# Patient Record
Sex: Female | Born: 1985 | Race: Black or African American | Hispanic: No | Marital: Married | Smoking: Never smoker
Health system: Southern US, Community
[De-identification: ages and names within clinical notes are randomized; demographics above are authoritative.]

## PROBLEM LIST (undated history)

## (undated) ENCOUNTER — Inpatient Hospital Stay (HOSPITAL_COMMUNITY): Payer: Self-pay

## (undated) DIAGNOSIS — R51 Headache: Secondary | ICD-10-CM

## (undated) DIAGNOSIS — R519 Headache, unspecified: Secondary | ICD-10-CM

## (undated) HISTORY — PX: NO PAST SURGERIES: SHX2092

---

## 2015-05-24 ENCOUNTER — Inpatient Hospital Stay (HOSPITAL_COMMUNITY)
Admission: AD | Admit: 2015-05-24 | Discharge: 2015-05-24 | Disposition: A | Discharge status: Home / Self Care | Payer: Self-pay | Source: Ambulatory Visit | Attending: Family Medicine | Admitting: Family Medicine

## 2015-05-24 ENCOUNTER — Encounter (HOSPITAL_COMMUNITY): Payer: Self-pay | Admitting: *Deleted

## 2015-05-24 ENCOUNTER — Inpatient Hospital Stay (HOSPITAL_COMMUNITY): Payer: Self-pay

## 2015-05-24 DIAGNOSIS — O3680X0 Pregnancy with inconclusive fetal viability, not applicable or unspecified: Secondary | ICD-10-CM

## 2015-05-24 DIAGNOSIS — O4691 Antepartum hemorrhage, unspecified, first trimester: Secondary | ICD-10-CM

## 2015-05-24 DIAGNOSIS — O209 Hemorrhage in early pregnancy, unspecified: Secondary | ICD-10-CM | POA: Insufficient documentation

## 2015-05-24 HISTORY — DX: Headache, unspecified: R51.9

## 2015-05-24 HISTORY — DX: Headache: R51

## 2015-05-24 LAB — WET PREP, GENITAL
Sperm: NONE SEEN
TRICH WET PREP: NONE SEEN
Yeast Wet Prep HPF POC: NONE SEEN

## 2015-05-24 LAB — CBC WITH DIFFERENTIAL/PLATELET
BASOS PCT: 0 %
Basophils Absolute: 0 10*3/uL (ref 0.0–0.1)
EOS PCT: 2 %
Eosinophils Absolute: 0.2 10*3/uL (ref 0.0–0.7)
HCT: 35.1 % — ABNORMAL LOW (ref 36.0–46.0)
Hemoglobin: 12.1 g/dL (ref 12.0–15.0)
Lymphocytes Relative: 49 %
Lymphs Abs: 4.6 10*3/uL — ABNORMAL HIGH (ref 0.7–4.0)
MCH: 28.7 pg (ref 26.0–34.0)
MCHC: 34.5 g/dL (ref 30.0–36.0)
MCV: 83.2 fL (ref 78.0–100.0)
MONO ABS: 0.6 10*3/uL (ref 0.1–1.0)
Monocytes Relative: 7 %
Neutro Abs: 3.8 10*3/uL (ref 1.7–7.7)
Neutrophils Relative %: 42 %
PLATELETS: 329 10*3/uL (ref 150–400)
RBC: 4.22 MIL/uL (ref 3.87–5.11)
RDW: 13.2 % (ref 11.5–15.5)
WBC: 9.2 10*3/uL (ref 4.0–10.5)

## 2015-05-24 LAB — URINALYSIS, ROUTINE W REFLEX MICROSCOPIC
Bilirubin Urine: NEGATIVE
Glucose, UA: NEGATIVE mg/dL
Ketones, ur: NEGATIVE mg/dL
LEUKOCYTES UA: NEGATIVE
NITRITE: NEGATIVE
Protein, ur: NEGATIVE mg/dL
SPECIFIC GRAVITY, URINE: 1.015 (ref 1.005–1.030)
pH: 7 (ref 5.0–8.0)

## 2015-05-24 LAB — URINE MICROSCOPIC-ADD ON: WBC, UA: NONE SEEN WBC/hpf (ref 0–5)

## 2015-05-24 LAB — HCG, QUANTITATIVE, PREGNANCY: hCG, Beta Chain, Quant, S: 70 m[IU]/mL — ABNORMAL HIGH (ref <5)

## 2015-05-24 LAB — ABO/RH: ABO/RH(D): A POS

## 2015-05-24 LAB — POCT PREGNANCY, URINE: PREG TEST UR: POSITIVE — AB

## 2015-05-24 MED ORDER — CYCLOBENZAPRINE HCL 10 MG PO TABS
10.0000 mg | ORAL_TABLET | Freq: Once | ORAL | Status: AC
  Administered 2015-05-24: 10 mg via ORAL
  Filled 2015-05-24: qty 1

## 2015-05-24 NOTE — MAU Provider Note (Signed)
History     CSN: [id number]  Arrival date and time: 05/24/15 1258   First Provider Initiated Contact with Patient 05/24/15 1410      Chief Complaint  Patient presents with  . Headache  . Possible Pregnancy   HPI Breanna Ochoa 29 y.o. G1P0 newly pregnant female presents to MAU complaining of abdominal pain and headache.  She has a h/o headaches but they have been worse over the last few days.  She woke up with one at 4am today.  Tylenol was not helpful and HA continues to now.   She has had irregular bleeding on and off over the last two weeks.  She also has abdominal pain that started 2 days ago.  She went to Urgent Care 2 days ago where she had a positive pregnancy test and was told to use Tylenol.  She denies fever, dysuria, weakness, nausea, vomiting OB History    Gravida Para Term Preterm AB TAB SAB Ectopic Multiple Living   1         0      Past Medical History  Diagnosis Date  . Headache     Past Surgical History  Procedure Laterality Date  . No past surgeries      History reviewed. No pertinent family history.  Social History  Substance Use Topics  . Smoking status: Never Smoker   . Smokeless tobacco: None  . Alcohol Use: No    Allergies: No Known Allergies  No prescriptions prior to admission    ROS Pertinent ROS in HPI.  All other systems are negative.   Physical Exam   Blood pressure 115/70, pulse 75, temperature 98.3 F (36.8 C), resp. rate 18, height  (1.626 m), weight 130 lb (58.968 kg), last menstrual period 05/24/2015.  Physical Exam  Constitutional: She is oriented to person, place, and time. She appears well-developed and well-nourished. No distress.  HENT:  Head: Normocephalic and atraumatic.  Eyes: Conjunctivae and EOM are normal.  Neck: Normal range of motion. Neck supple.  Cardiovascular: Normal rate and normal heart sounds.   Respiratory: Effort normal. No respiratory distress.  GI: Soft. Bowel sounds are normal. She exhibits  no distension. There is tenderness. There is no rebound and no guarding.  LLQ tender to palpation  Musculoskeletal: Normal range of motion. She exhibits no edema.  Neurological: She is alert and oriented to person, place, and time.  Skin: Skin is warm and dry.  Psychiatric: She has a normal mood and affect. Her behavior is normal.  Gyn: mod amt of dark red blood present in vault.  No active bleeding.  No tenderness of adnexa.  No CMT.   Results for orders placed or performed during the hospital encounter of 05/24/15 (from the past 24 hour(s))  Urinalysis, Routine w reflex microscopic (not at [facility])     Status: Abnormal   Collection Time: 05/24/15  1:30 PM  Result Value Ref Range   Color, Urine YELLOW YELLOW   APPearance CLEAR CLEAR   Specific Gravity, Urine 1.015 1.005 - 1.030   pH 7.0 5.0 - 8.0   Glucose, UA NEGATIVE NEGATIVE mg/dL   Hgb urine dipstick LARGE (A) NEGATIVE   Bilirubin Urine NEGATIVE NEGATIVE   Ketones, ur NEGATIVE NEGATIVE mg/dL   Protein, ur NEGATIVE NEGATIVE mg/dL   Nitrite NEGATIVE NEGATIVE   Leukocytes, UA NEGATIVE NEGATIVE  Urine microscopic-add on     Status: Abnormal   Collection Time: 05/24/15  1:30 PM  Result Value Ref Range  Squamous Epithelial / LPF 0-5 (A) NONE SEEN   WBC, UA NONE SEEN 0 - 5 WBC/hpf   RBC / HPF 0-5 0 - 5 RBC/hpf   Bacteria, UA RARE (A) NONE SEEN  Pregnancy, urine POC     Status: Abnormal   Collection Time: 05/24/15  1:33 PM  Result Value Ref Range   Preg Test, Ur POSITIVE (A) NEGATIVE  Wet prep, genital     Status: Abnormal   Collection Time: 05/24/15  2:23 PM  Result Value Ref Range   Yeast Wet Prep HPF POC NONE SEEN NONE SEEN   Trich, Wet Prep NONE SEEN NONE SEEN   Clue Cells Wet Prep HPF POC PRESENT (A) NONE SEEN   WBC, Wet Prep HPF POC MANY (A) NONE SEEN   Sperm NONE SEEN   CBC with Differential/Platelet     Status: Abnormal   Collection Time: 05/24/15  2:33 PM  Result Value Ref Range   WBC 9.2 4.0 - 10.5 K/uL   RBC 4.22  3.87 - 5.11 MIL/uL   Hemoglobin 12.1 12.0 - 15.0 g/dL   HCT 16.135.1 (L) 09.636.0 - 04.546.0 %   MCV 83.2 78.0 - 100.0 fL   MCH 28.7 26.0 - 34.0 pg   MCHC 34.5 30.0 - 36.0 g/dL   RDW 40.913.2 81.111.5 - 91.415.5 %   Platelets 329 150 - 400 K/uL   Neutrophils Relative % 42 %   Neutro Abs 3.8 1.7 - 7.7 K/uL   Lymphocytes Relative 49 %   Lymphs Abs 4.6 (H) 0.7 - 4.0 K/uL   Monocytes Relative 7 %   Monocytes Absolute 0.6 0.1 - 1.0 K/uL   Eosinophils Relative 2 %   Eosinophils Absolute 0.2 0.0 - 0.7 K/uL   Basophils Relative 0 %   Basophils Absolute 0.0 0.0 - 0.1 K/uL  ABO/Rh     Status: None (Preliminary result)   Collection Time: 05/24/15  2:33 PM  Result Value Ref Range   ABO/RH(D) A POS   hCG, quantitative, pregnancy     Status: Abnormal   Collection Time: 05/24/15  2:33 PM  Result Value Ref Range   hCG, Beta Chain, Quant, S 70 (H) <5 mIU/mL   [place] Ob Comp Less 14 Wks  05/24/2015  CLINICAL DATA:  First trimester pregnancy, vaginal bleeding EXAM: OBSTETRIC <14 WK US AND TRANSVAGINAL OB US TECHNIQUE: Both transabdominal and transvaginal ultrasound examinations were performed for complete evaluation of the gestation as well as the maternal uterus, adnexal regions, and pelvic cul-de-sac. Transvaginal technique was performed to assess early pregnancy. COMPARISON:  None. FINDINGS: Intrauterine gestational sac: Not visualized Yolk sac:  Not visualized Embryo:  Not visualized Cardiac Activity: Not visualized Maternal uterus/adnexae: Endometrium is thickened to 11 mm. Ovaries are normal. Small simple cyst possibly a corpus luteum left ovary. No free fluid. IMPRESSION: Endometrial thickening. Small simple cyst left ovary possibly a corpus luteum. Recommend follow-up quantitative B-HCG levels and follow-up US in 14 days to confirm and assess viability. This recommendation follows SRU consensus guidelines: Diagnostic Criteria for Nonviable Pregnancy Early in the First Trimester. Malva Limes Engl J Med 2013; 782:9562-13; 369:1443-51. Electronically  Signed   By: Esperanza Heiraymond  Rubner M.D.   On: 05/24/2015 15:30   [place] Ob Transvaginal  05/24/2015  CLINICAL DATA:  First trimester pregnancy, vaginal bleeding EXAM: OBSTETRIC <14 WK US AND TRANSVAGINAL OB US TECHNIQUE: Both transabdominal and transvaginal ultrasound examinations were performed for complete evaluation of the gestation as well as the maternal uterus, adnexal regions, and pelvic cul-de-sac. Transvaginal  technique was performed to assess early pregnancy. COMPARISON:  None. FINDINGS: Intrauterine gestational sac: Not visualized Yolk sac:  Not visualized Embryo:  Not visualized Cardiac Activity: Not visualized Maternal uterus/adnexae: Endometrium is thickened to 11 mm. Ovaries are normal. Small simple cyst possibly a corpus luteum left ovary. No free fluid. IMPRESSION: Endometrial thickening. Small simple cyst left ovary possibly a corpus luteum. Recommend follow-up quantitative B-HCG levels and follow-up US in 14 days to confirm and assess viability. This recommendation follows SRU consensus guidelines: Diagnostic Criteria for Nonviable Pregnancy Early in the First Trimester. Malva Limes Med 2013; 161:0960-45. Electronically Signed   By: Esperanza Heir M.D.   On: 05/24/2015 15:30    MAU Course  Procedures  MDM Ectopic workup ordered to eval vaginal bleeding in early pregnancy Flexeril ordered for discomfort Rh positive blood type No evidence for ectopic on u/s but cannot confirm location Hemodynamically stable  Assessment and Plan  A:  1. Pregnancy of unknown anatomic location   2. Vaginal bleeding in pregnancy, first trimester    P: Discharge to home OTC Tylenol for discomfort Ectopic precautions discussed.  Return for heavy bleeding or severe pain.  Return to MAU in 48 hours for repeat quant HCG Patient may return to MAU as needed or if her condition were to change or worsen   Bertram Denver 05/24/2015, 2:10 PM

## 2015-05-24 NOTE — MAU Note (Signed)
Pt presents to MAU with complaints of a headache and lower back pain. Pt was evaluated at urgent care on Wednesday for the same symptoms and then told she was pregnant. Significant other states he brought her here due to the headache and back pain

## 2015-05-24 NOTE — Discharge Instructions (Signed)
Eating Plan for Pregnant Women °While you are pregnant, your body will require additional nutrition to help support your growing baby. It is recommended that you consume: °· 150 additional calories each day during your first trimester. °· 300 additional calories each day during your second trimester. °· 300 additional calories each day during your third trimester. °Eating a healthy, well-balanced diet is very important for your health and for your baby's health. You also have a higher need for some vitamins and minerals, such as folic acid, calcium, iron, and vitamin D. °WHAT DO I NEED TO KNOW ABOUT EATING DURING PREGNANCY? °· Do not try to lose weight or go on a diet during pregnancy. °· Choose healthy, nutritious foods. Choose ½ of a sandwich with a glass of milk instead of a candy bar or a high-calorie sugar-sweetened beverage. °· Limit your overall intake of foods that have "empty calories." These are foods that have little nutritional value, such as sweets, desserts, candies, sugar-sweetened beverages, and fried foods. °· Eat a variety of foods, especially fruits and vegetables. °· Take a prenatal vitamin to help meet the additional needs during pregnancy, specifically for folic acid, iron, calcium, and vitamin D. °· Remember to stay active. Ask your health care provider for exercise recommendations that are specific to you. °· Practice good food safety and cleanliness, such as washing your hands before you eat and after you prepare raw meat. This helps to prevent foodborne illnesses, such as listeriosis, that can be very dangerous for your baby. Ask your health care provider for more information about listeriosis. °WHAT DOES 150 EXTRA CALORIES LOOK LIKE? °Healthy options for an additional 150 calories each day could be any of the following: °· Plain low-fat yogurt (6-8 oz) with ½ cup of berries. °· 1 apple with 2 teaspoons of peanut butter. °· Cut-up vegetables with ¼ cup of hummus. °· Low-fat chocolate milk  (8 oz or 1 cup). °· 1 string cheese with 1 medium orange. °· ½ of a peanut butter and jelly sandwich on whole-wheat bread (1 tsp of peanut butter). °For 300 calories, you could eat two of those healthy options each day.  °WHAT IS A HEALTHY AMOUNT OF WEIGHT TO GAIN? °The recommended amount of weight for you to gain is based on your pre-pregnancy BMI. If your pre-pregnancy BMI was: °· Less than 18 (underweight), you should gain 28-40 lb. °· 18-24.9 (normal), you should gain 25-35 lb. °· 25-29.9 (overweight), you should gain 15-25 lb. °· Greater than 30 (obese), you should gain 11-20 lb. °WHAT IF I AM HAVING TWINS OR MULTIPLES? °Generally, pregnant women who will be having twins or multiples may need to increase their daily calories by 300-600 calories each day. The recommended range for total weight gain is 25-54 lb, depending on your pre-pregnancy BMI. Talk with your health care provider for specific guidance about additional nutritional needs, weight gain, and exercise during your pregnancy. °WHAT FOODS CAN I EAT? °Grains °Any grains. Try to choose whole grains, such as whole-wheat bread, oatmeal, or brown rice. °Vegetables °Any vegetables. Try to eat a variety of colors and types of vegetables to get a full range of vitamins and minerals. Remember to wash your vegetables well before eating. °Fruits °Any fruits. Try to eat a variety of colors and types of fruit to get a full range of vitamins and minerals. Remember to wash your fruits well before eating. °Meats and Other Protein Sources °Lean meats, including chicken, turkey, fish, and lean cuts of beef, veal, or pork.   Make sure that all meats are cooked to "well done." Tofu. Tempeh. Beans. Eggs. Peanut butter and other nut butters. Seafood, such as shrimp, crab, and lobster. If you choose fish, select types that are higher in omega-3 fatty acids, including salmon, herring, mussels, trout, sardines, and pollock. Make sure that all meats are cooked to food-safe  temperatures. Dairy Pasteurized milk and milk alternatives. Pasteurized yogurt and pasteurized cheese. Cottage cheese. Sour cream. Beverages Water. Juices that contain 100% fruit juice or vegetable juice. Caffeine-free teas and decaffeinated coffee. Drinks that contain caffeine are okay to drink, but it is better to avoid caffeine. Keep your total caffeine intake to less than 200 mg each day (12 oz of coffee, tea, or soda) or as directed by your health care provider. Condiments Any pasteurized condiments. Sweets and Desserts Any sweets and desserts. Fats and Oils Any fats and oils. The items listed above may not be a complete list of recommended foods or beverages. Contact your dietitian for more options. WHAT FOODS ARE NOT RECOMMENDED? Vegetables Unpasteurized (raw) vegetable juices. Fruits Unpasteurized (raw) fruit juices. Meats and Other Protein Sources Cured meats that have nitrates, such as bacon, salami, and hotdogs. Luncheon meats, bologna, or other deli meats (unless they are reheated until they are steaming hot). Refrigerated pate, meat spreads from a meat counter, smoked seafood that is found in the refrigerated section of a store. Raw fish, such as sushi or sashimi. High mercury content fish, such as tilefish, shark, swordfish, and king mackerel. Raw meats, such as tuna or beef tartare. Undercooked meats and poultry. Make sure that all meats are cooked to food-safe temperatures. Dairy Unpasteurized (raw) milk and any foods that have raw milk in them. Soft cheeses, such as feta, queso blanco, queso fresco, Brie, Camembert cheeses, blue-veined cheeses, and Panela cheese (unless it is made with pasteurized milk, which must be stated on the label). Beverages Alcohol. Sugar-sweetened beverages, such as sodas, teas, or energy drinks. Condiments Homemade fermented foods and drinks, such as pickles, sauerkraut, or kombucha drinks. (Store-bought pasteurized versions of these are  okay.) Other Salads that are made in the store, such as ham salad, chicken salad, egg salad, tuna salad, and seafood salad. The items listed above may not be a complete list of foods and beverages to avoid. Contact your dietitian for more information.   This information is not intended to replace advice given to you by your health care provider. Make sure you discuss any questions you have with your health care provider.   Document Released: 03/30/2014 Document Reviewed: 03/30/2014 Elsevier Interactive Patient Education [employer] Ectopic Pregnancy An ectopic pregnancy is when the fertilized egg attaches (implants) outside the uterus. Most ectopic pregnancies occur in the fallopian tube. Rarely do ectopic pregnancies occur on the ovary, intestine, pelvis, or cervix. In an ectopic pregnancy, the fertilized egg does not have the ability to develop into a normal, healthy baby.  A ruptured ectopic pregnancy is one in which the fallopian tube gets torn or bursts and results in internal bleeding. Often there is intense abdominal pain, and sometimes, vaginal bleeding. Having an ectopic pregnancy can be life threatening. If left untreated, this dangerous condition can lead to a blood transfusion, abdominal surgery, or even death. CAUSES  Damage to the fallopian tubes is the suspected cause in most ectopic pregnancies.  RISK FACTORS Depending on your circumstances, the risk of having an ectopic pregnancy will vary. The level of risk can be divided into three categories. High Risk  You have  gone through infertility treatment.  You have had a previous ectopic pregnancy.  You have had previous tubal surgery.  You have had previous surgery to have the fallopian tubes tied (tubal ligation).  You have tubal problems or diseases.  You have been exposed to DES. DES is a medicine that was used until 1971 and had effects on babies whose mothers took the medicine.  You become pregnant while using  an intrauterine device (IUD) for birth control. Moderate Risk  You have a history of infertility.  You have a history of a sexually transmitted infection (STI).  You have a history of pelvic inflammatory disease (PID).  You have scarring from endometriosis.  You have multiple sexual partners.  You smoke. Low Risk  You have had previous pelvic surgery.  You use vaginal douching.  You became sexually active before 27 years of age. SIGNS AND SYMPTOMS  An ectopic pregnancy should be suspected in anyone who has missed a period and has abdominal pain or bleeding.  You may experience normal pregnancy symptoms, such as:  Nausea.  Tiredness.  Breast tenderness.  Other symptoms may include:  Pain with intercourse.  Irregular vaginal bleeding or spotting.  Cramping or pain on one side or in the lower abdomen.  Fast heartbeat.  Passing out while having a bowel movement.  Symptoms of a ruptured ectopic pregnancy and internal bleeding may include:  Sudden, severe pain in the abdomen and pelvis.  Dizziness or fainting.  Pain in the shoulder area. DIAGNOSIS  Tests that may be performed include:  A pregnancy test.  An ultrasound test.  Testing the specific level of pregnancy hormone in the bloodstream.  Taking a sample of uterus tissue (dilation and curettage, D&C).  Surgery to perform a visual exam of the inside of the abdomen using a thin, lighted tube with a tiny camera on the end (laparoscope). TREATMENT  An injection of a medicine called methotrexate may be given. This medicine causes the pregnancy tissue to be absorbed. It is given if:  The diagnosis is made early.  The fallopian tube has not ruptured.  You are considered to be a good candidate for the medicine. Usually, pregnancy hormone blood levels are checked after methotrexate treatment. This is to be sure the medicine is effective. It may take 4-6 weeks for the pregnancy to be absorbed (though most  pregnancies will be absorbed by 3 weeks). Surgical treatment may be needed. A laparoscope may be used to remove the pregnancy tissue. If severe internal bleeding occurs, a cut (incision) may be made in the lower abdomen (laparotomy), and the ectopic pregnancy is removed. This stops the bleeding. Part of the fallopian tube, or the whole tube, may be removed as well (salpingectomy). After surgery, pregnancy hormone tests may be done to be sure there is no pregnancy tissue left. You may receive a Rho (D) immune globulin shot if you are Rh negative and the father is Rh positive, or if you do not know the Rh type of the father. This is to prevent problems with any future pregnancy. SEEK IMMEDIATE MEDICAL CARE IF:  You have any symptoms of an ectopic pregnancy. This is a medical emergency. MAKE SURE YOU:  Understand these instructions.  Will watch your condition.  Will get help right away if you are not doing well or get worse.   This information is not intended to replace advice given to you by your health care provider. Make sure you discuss any questions you have with your health  care provider.   Document Released: 07/23/2004 Document Revised: 07/06/2014 Document Reviewed: 01/12/2013 Elsevier Interactive Patient Education [employer]

## 2015-05-25 LAB — HIV ANTIBODY (ROUTINE TESTING W REFLEX): HIV Screen 4th Generation wRfx: NONREACTIVE

## 2015-05-25 LAB — RPR: RPR: NONREACTIVE

## 2015-05-26 ENCOUNTER — Encounter (HOSPITAL_COMMUNITY): Payer: Self-pay | Admitting: Advanced Practice Midwife

## 2015-05-26 ENCOUNTER — Inpatient Hospital Stay (HOSPITAL_COMMUNITY)
Admission: AD | Admit: 2015-05-26 | Discharge: 2015-05-26 | Disposition: A | Discharge status: Home / Self Care | Payer: Self-pay | Source: Ambulatory Visit | Attending: Family Medicine | Admitting: Family Medicine

## 2015-05-26 DIAGNOSIS — O034 Incomplete spontaneous abortion without complication: Secondary | ICD-10-CM

## 2015-05-26 DIAGNOSIS — O039 Complete or unspecified spontaneous abortion without complication: Secondary | ICD-10-CM

## 2015-05-26 DIAGNOSIS — Z3A01 Less than 8 weeks gestation of pregnancy: Secondary | ICD-10-CM | POA: Insufficient documentation

## 2015-05-26 DIAGNOSIS — B9689 Other specified bacterial agents as the cause of diseases classified elsewhere: Secondary | ICD-10-CM

## 2015-05-26 DIAGNOSIS — O209 Hemorrhage in early pregnancy, unspecified: Secondary | ICD-10-CM | POA: Insufficient documentation

## 2015-05-26 DIAGNOSIS — N760 Acute vaginitis: Secondary | ICD-10-CM

## 2015-05-26 LAB — HCG, QUANTITATIVE, PREGNANCY: HCG, BETA CHAIN, QUANT, S: 27 m[IU]/mL — AB (ref <5)

## 2015-05-26 MED ORDER — METRONIDAZOLE 500 MG PO TABS: 500.0000 mg | ORAL_TABLET | Freq: Two times a day (BID) | ORAL | Status: DC

## 2015-05-26 NOTE — MAU Provider Note (Signed)
Chief Complaint  Patient presents with  . Labs Only    Subjective:   Pt is a 29 y.o. G1P0 here for follow-up BHCG.  Upon review of the records patient was first seen on 05/24/15 for vaginal bleeding in early pregnancy.   BHCG on that day was 70.  Ultrasound showed Endometrial thickening and CLCyst.  GC/CT and wet prep were collected.    Pt here today with no report of abdominal pain or vaginal bleeding.   All other systems negative.      RN Note: Pt presents to MAU for repeat labs, BHCG. Denies any pain, reports vaginal bleeding.           Past Medical History  Diagnosis Date  . Headache     OB History  Gravida Para Term Preterm AB SAB TAB Ectopic Multiple Living  1         0    # Outcome Date GA Lbr Len/2nd Weight Sex Delivery Anes PTL Lv  1 Current               No family history on file. Medical, Surgical, Family and Social histories reviewed and are listed above.  Medications and allergies reviewed.  Review of Systems: Const:  No apparent distress Resp:  No shortness of breath Abd:  Denies N/V/D/C           Denies abdominal pain GU:  Denies dysuria or bleeding Other systems negative  Objective: Physical Exam  Filed Vitals:   05/26/15 1259  BP: 112/70  Pulse: 76  Resp: 18   Constitutional: She is oriented to person, place, and time. She appears well-developed and well-nourished. No distress.  Pulmonary/Chest: Effort normal. No respiratory distress.  Musculoskeletal: Normal range of motion.  Neurological: She is alert and oriented to person, place, and time.  Skin: Skin is warm and dry.    Assessment: 29 y.o. G1P0 at 2d wks Pregnancy Follow-up BHCG  Plan: Discharge home Was never given Rx for Flagyl or Flexeril the other day States does not have pain anymore, but needs Rx for Flagly (provided) Plan repeat Quant HCG next week in clinic Monday Dec 5

## 2015-05-26 NOTE — Discharge Instructions (Signed)
Miscarriage  A miscarriage is the sudden loss of an unborn baby (fetus) before the 20th week of pregnancy. Most miscarriages happen in the first 3 months of pregnancy. Sometimes, it happens before a woman even knows she is pregnant. A miscarriage is also called a "spontaneous miscarriage" or "early pregnancy loss." Having a miscarriage can be an emotional experience. Talk with your caregiver about any questions you may have about miscarrying, the grieving process, and your future pregnancy plans.  CAUSES    Problems with the fetal chromosomes that make it impossible for the baby to develop normally. Problems with the baby's genes or chromosomes are most often the result of errors that occur, by chance, as the embryo divides and grows. The problems are not inherited from the parents.   Infection of the cervix or uterus.    Hormone problems.    Problems with the cervix, such as having an incompetent cervix. This is when the tissue in the cervix is not strong enough to hold the pregnancy.    Problems with the uterus, such as an abnormally shaped uterus, uterine fibroids, or congenital abnormalities.    Certain medical conditions.    Smoking, drinking alcohol, or taking illegal drugs.    Trauma.   Often, the cause of a miscarriage is unknown.   SYMPTOMS    Vaginal bleeding or spotting, with or without cramps or pain.   Pain or cramping in the abdomen or lower back.   Passing fluid, tissue, or blood clots from the vagina.  DIAGNOSIS   Your caregiver will perform a physical exam. You may also have an ultrasound to confirm the miscarriage. Blood or urine tests may also be ordered.  TREATMENT    Sometimes, treatment is not necessary if you naturally pass all the fetal tissue that was in the uterus. If some of the fetus or placenta remains in the body (incomplete miscarriage), tissue left behind may become infected and must be removed. Usually, a dilation and curettage (D and C) procedure is performed.  During a D and C procedure, the cervix is widened (dilated) and any remaining fetal or placental tissue is gently removed from the uterus.   Antibiotic medicines are prescribed if there is an infection. Other medicines may be given to reduce the size of the uterus (contract) if there is a lot of bleeding.   If you have Rh negative blood and your baby was Rh positive, you will need a Rh immunoglobulin shot. This shot will protect any future baby from having Rh blood problems in future pregnancies.  HOME CARE INSTRUCTIONS    Your caregiver may order bed rest or may allow you to continue light activity. Resume activity as directed by your caregiver.   Have someone help with home and family responsibilities during this time.    Keep track of the number of sanitary pads you use each day and how soaked (saturated) they are. Write down this information.    Do not use tampons. Do not douche or have sexual intercourse until approved by your caregiver.    Only take over-the-counter or prescription medicines for pain or discomfort as directed by your caregiver.    Do not take aspirin. Aspirin can cause bleeding.    Keep all follow-up appointments with your caregiver.    If you or your partner have problems with grieving, talk to your caregiver or seek counseling to help cope with the pregnancy loss. Allow enough time to grieve before trying to get pregnant again.     SEEK IMMEDIATE MEDICAL CARE IF:    You have severe cramps or pain in your back or abdomen.   You have a fever.   You pass large blood clots (walnut-sized or larger) ortissue from your vagina. Save any tissue for your caregiver to inspect.    Your bleeding increases.    You have a thick, bad-smelling vaginal discharge.   You become lightheaded, weak, or you faint.    You have chills.   MAKE SURE YOU:   Understand these instructions.   Will watch your condition.   Will get help right away if you are not doing well or get worse.     This  information is not intended to replace advice given to you by your health care provider. Make sure you discuss any questions you have with your health care provider.     Document Released: 12/09/2000 Document Revised: 10/10/2012 Document Reviewed: 08/04/2011  Elsevier Interactive Patient Education 2016 [employer] Inc.

## 2015-05-26 NOTE — MAU Note (Signed)
Pt presents to MAU for repeat labs, BHCG. Denies any pain, reports vaginal bleeding.

## 2015-05-27 LAB — GC/CHLAMYDIA PROBE AMP (~~LOC~~) NOT AT ARMC
Chlamydia: NEGATIVE
Neisseria Gonorrhea: NEGATIVE

## 2015-06-03 ENCOUNTER — Other Ambulatory Visit: Payer: Self-pay

## 2015-07-30 ENCOUNTER — Other Ambulatory Visit: Payer: Self-pay | Admitting: Family Medicine

## 2015-07-30 DIAGNOSIS — E010 Iodine-deficiency related diffuse (endemic) goiter: Secondary | ICD-10-CM

## 2015-08-01 ENCOUNTER — Ambulatory Visit
Admission: RE | Admit: 2015-08-01 | Discharge: 2015-08-01 | Disposition: A | Discharge status: Home / Self Care | Payer: BLUE CROSS/BLUE SHIELD | Source: Ambulatory Visit | Attending: Family Medicine | Admitting: Family Medicine

## 2015-08-01 DIAGNOSIS — E010 Iodine-deficiency related diffuse (endemic) goiter: Secondary | ICD-10-CM

## 2016-03-28 ENCOUNTER — Encounter (HOSPITAL_COMMUNITY): Payer: Self-pay

## 2016-05-12 IMAGING — US US OB COMP LESS 14 WK
1 series · 15 of 28 positions shown · non-contrast
Comparison: None.

CLINICAL DATA: First trimester pregnancy, vaginal bleeding

EXAM:
OBSTETRIC <14 WK US AND TRANSVAGINAL OB US
TECHNIQUE: Both transabdominal and transvaginal ultrasound examinations were
performed for complete evaluation of the gestation as well as the
maternal uterus, adnexal regions, and pelvic cul-de-sac.
Transvaginal technique was performed to assess early pregnancy.

[Series 1: us ob comp less 14 wk · 15 of 37 slices shown]
[im 1/37]
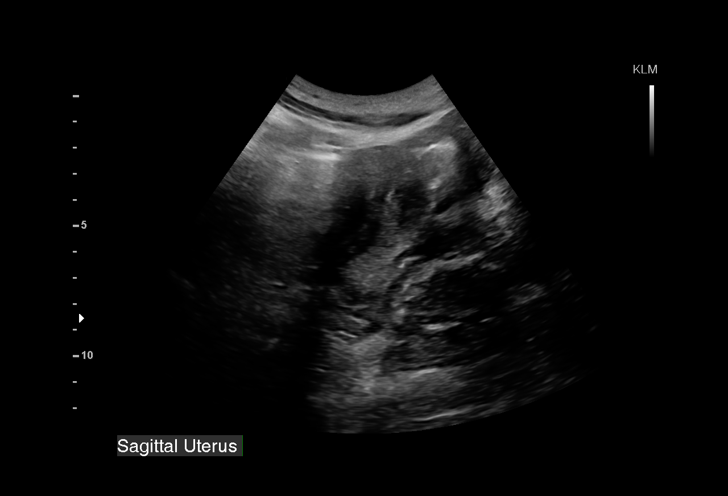
[im 3/37]
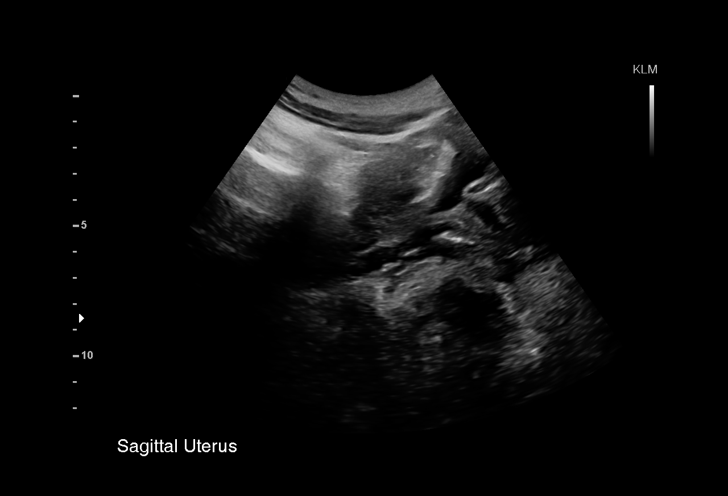
[im 6/37]
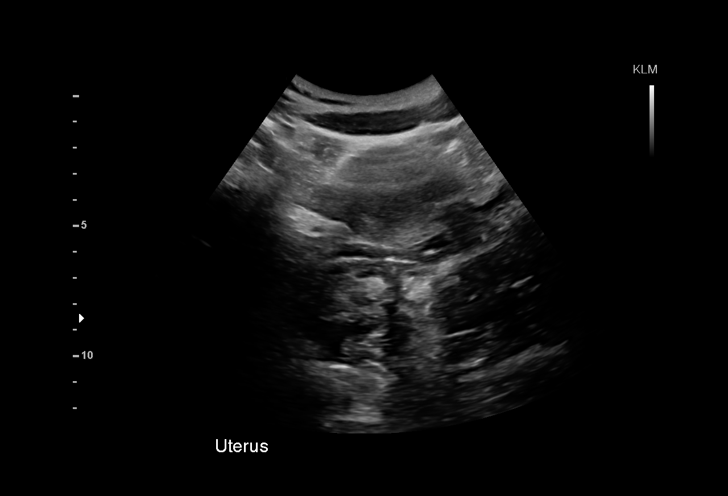
[im 9/37]
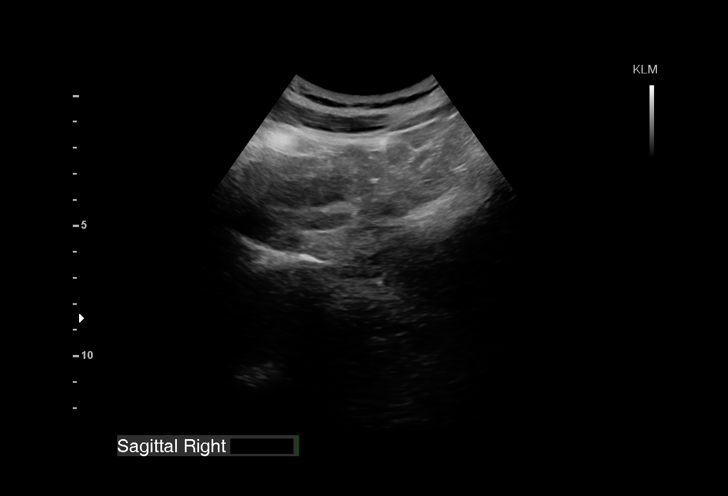
[im 11/37]
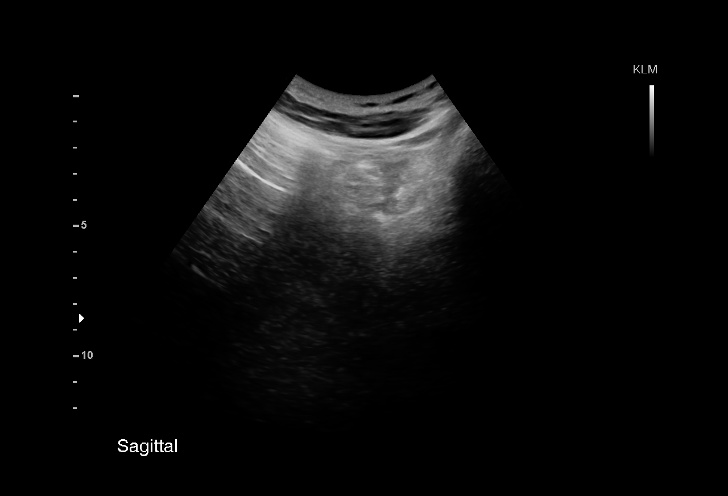
[im 14/37]
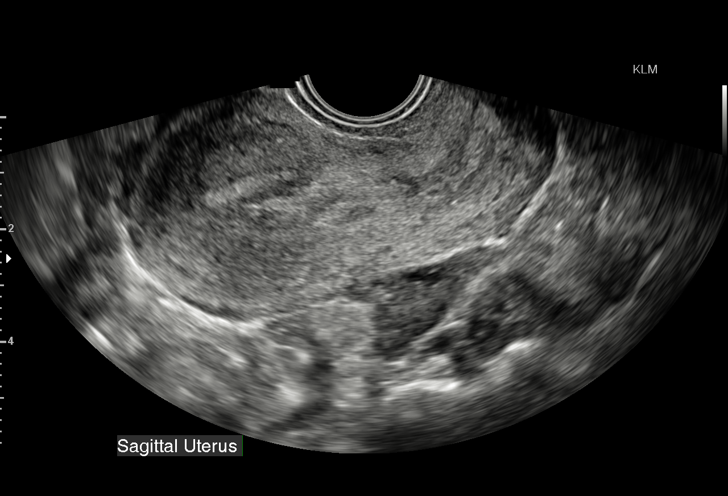
[im 17/37]
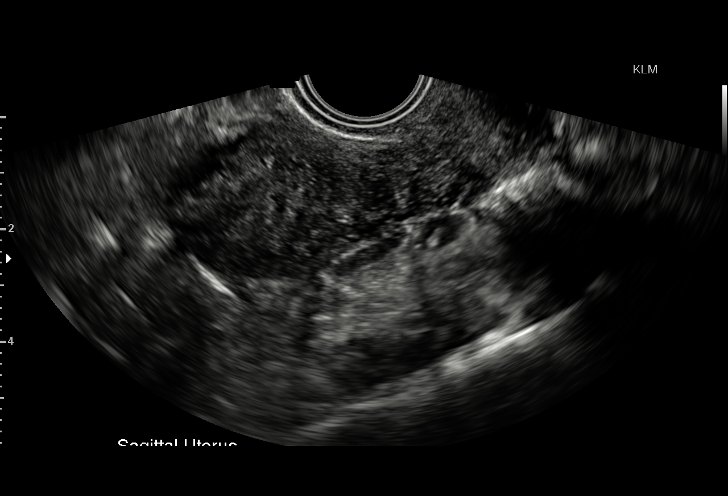
[im 19/37]
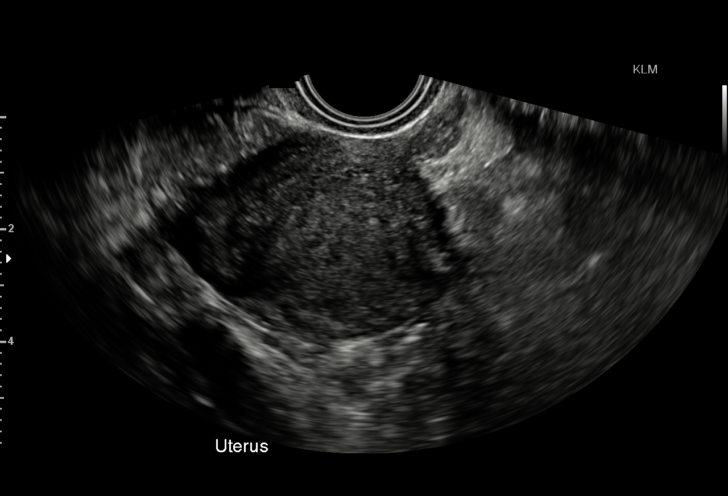
[im 21/37]
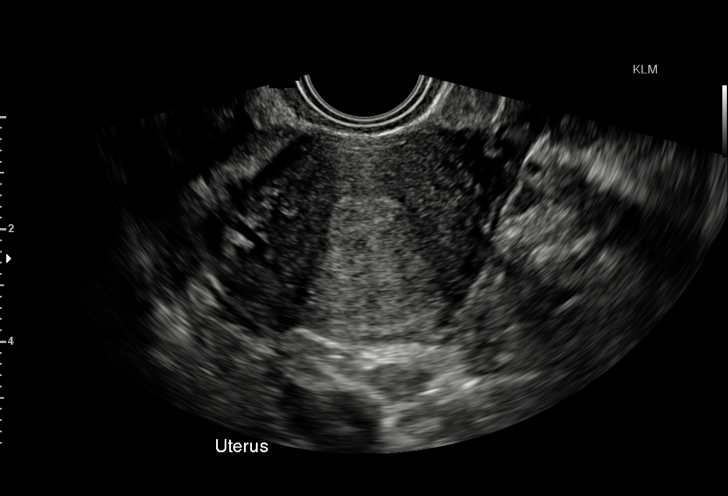
[im 23/37]
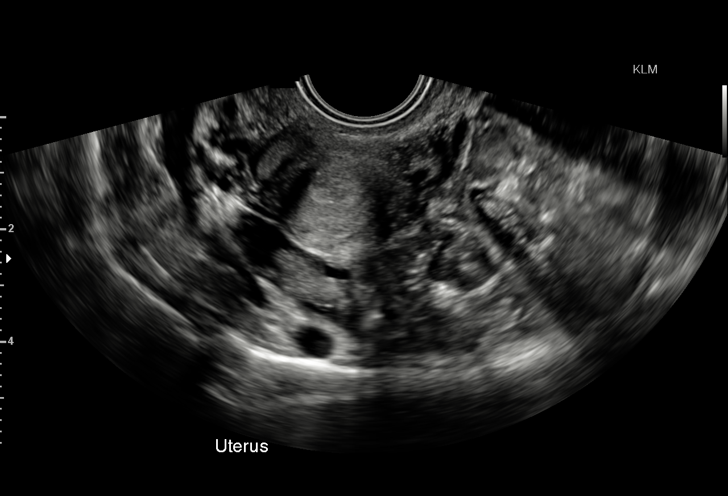
[im 26/37]
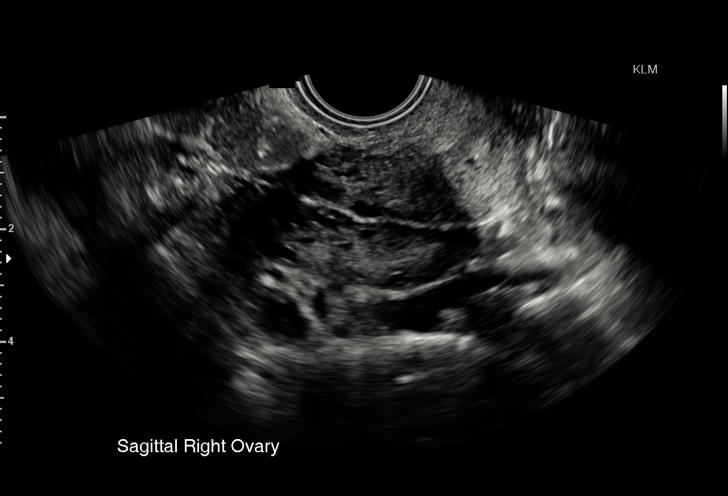
[im 29/37]
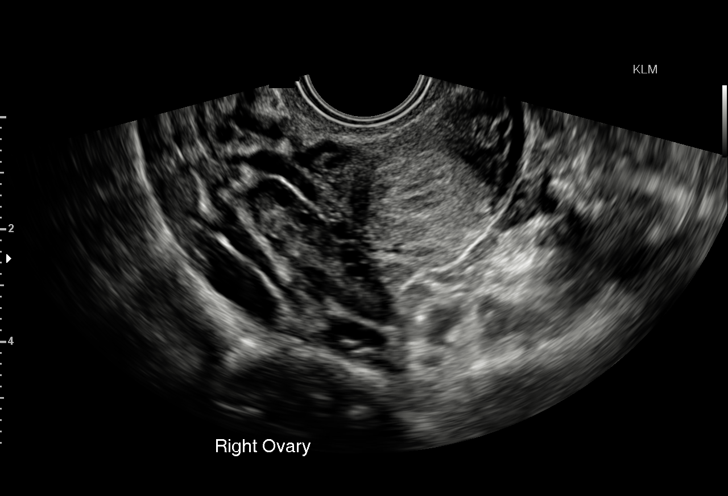
[im 31/37]
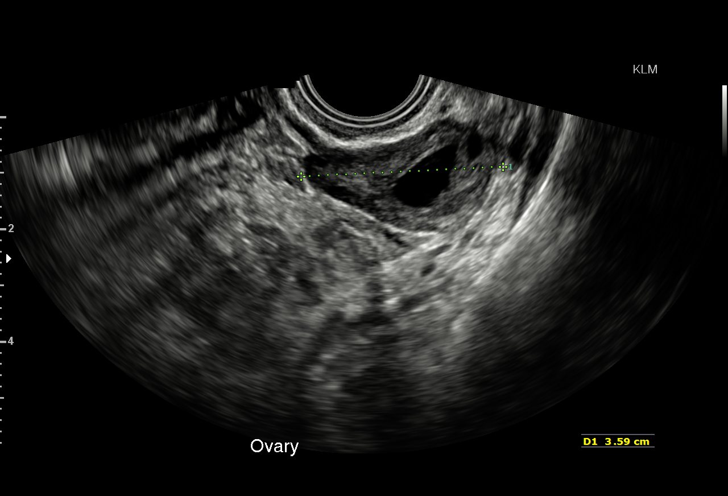
[im 34/37]
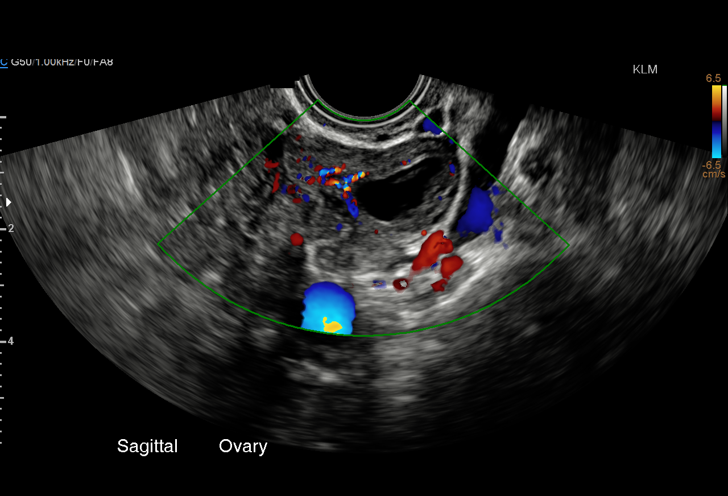
[im 37/37]
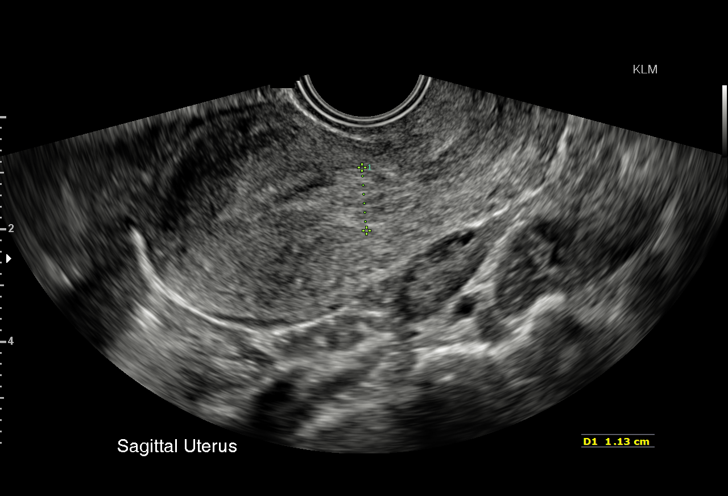

[15 of 28 positions shown; findings below may reference images not displayed]

FINDINGS: Intrauterine gestational sac: Not visualized

Yolk sac:  Not visualized

Embryo:  Not visualized

Cardiac Activity: Not visualized

Maternal uterus/adnexae: Endometrium is thickened to 11 mm. Ovaries
are normal. Small simple cyst possibly a corpus luteum left ovary.
No free fluid.
IMPRESSION: Endometrial thickening. Small simple cyst left ovary possibly a
corpus luteum. Recommend follow-up quantitative B-HCG levels and
follow-up US in 14 days to confirm and assess viability. This
recommendation follows SRU consensus guidelines: Diagnostic Criteria
for Nonviable Pregnancy Early in the First Trimester. N Engl J Med

## 2016-07-20 IMAGING — US US SOFT TISSUE HEAD/NECK
1 series · 14 of 25 positions shown · non-contrast
Comparison: None.

CLINICAL DATA: Thyromegaly on exam, left lateral neck palpable
lymph node

EXAM:
THYROID ULTRASOUND
TECHNIQUE: Ultrasound examination of the thyroid gland and adjacent soft
tissues was performed.

[Series 1: us soft tissue head/neck · 0.07mm/px · 14 of 45 slices shown]
[im 1/45]
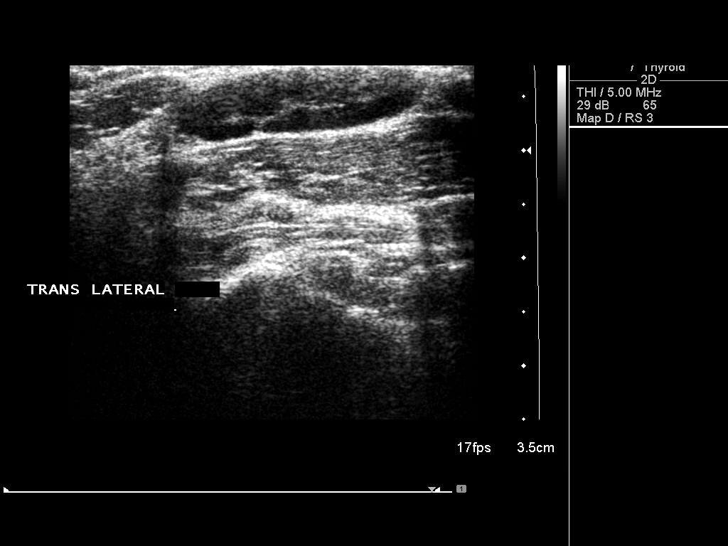
[im 4/45]
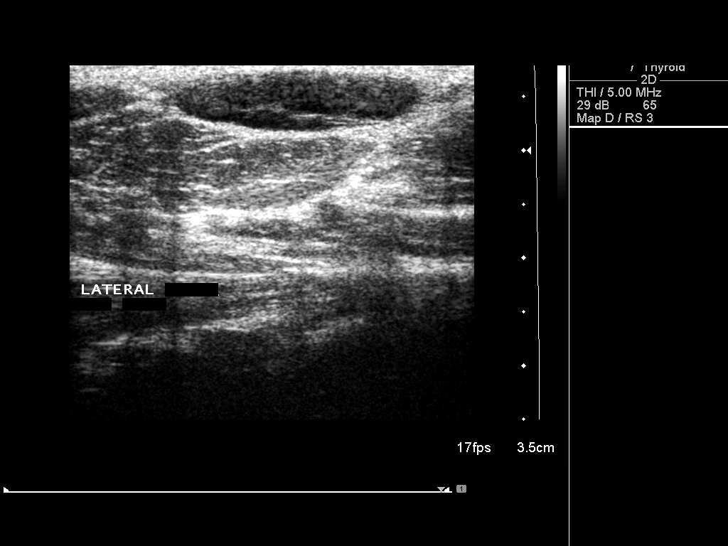
[im 8/45]
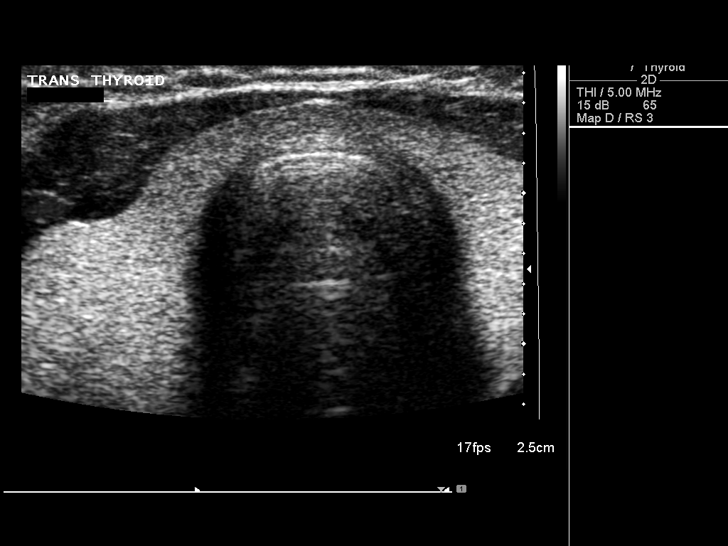
[im 12/45]
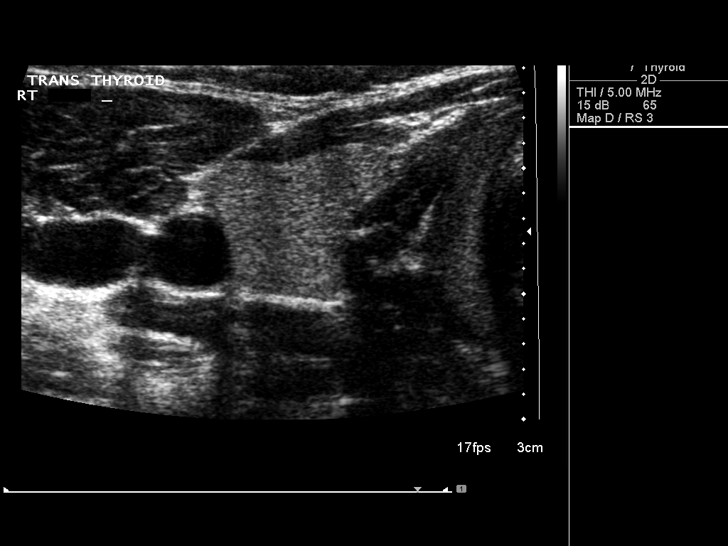
[im 15/45]
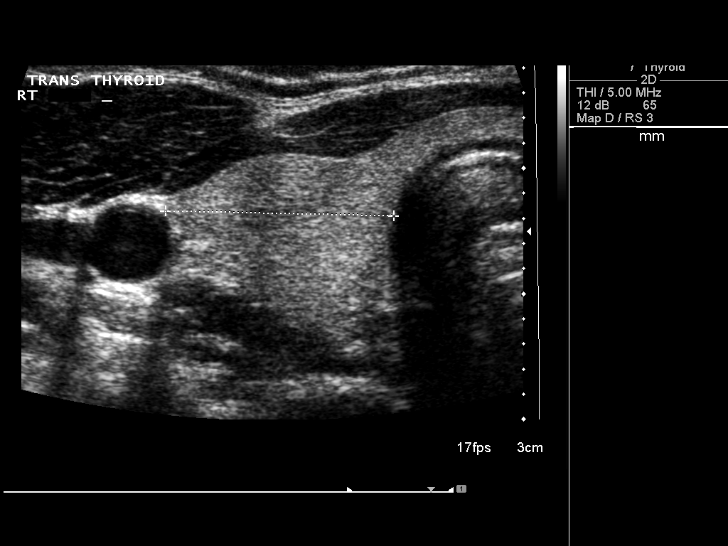
[im 17/45]
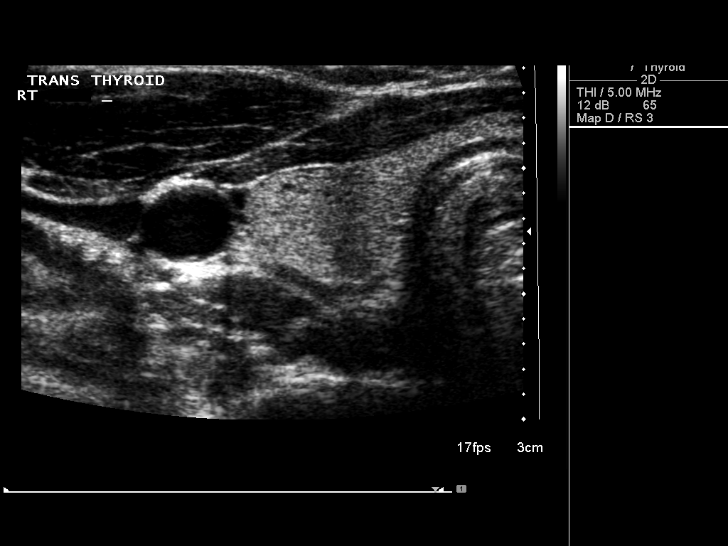
[im 21/45]
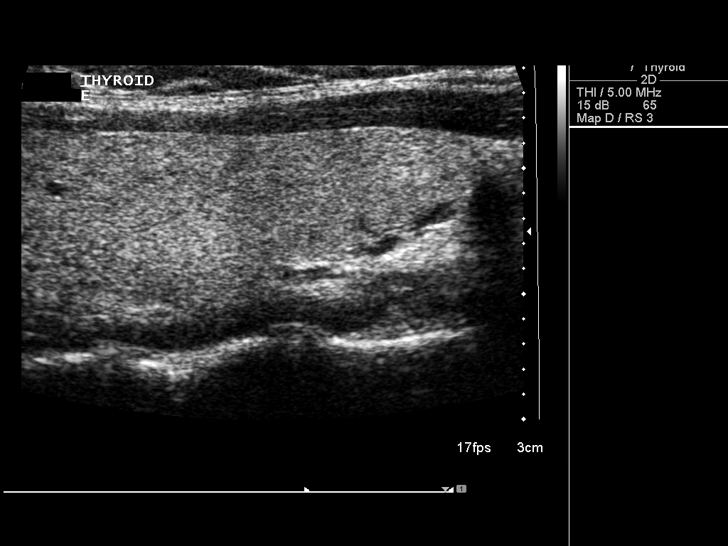
[im 24/45]
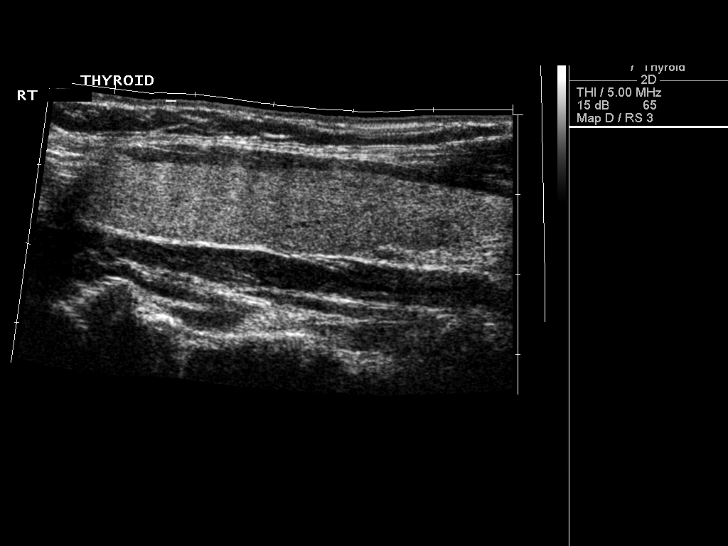
[im 28/45]
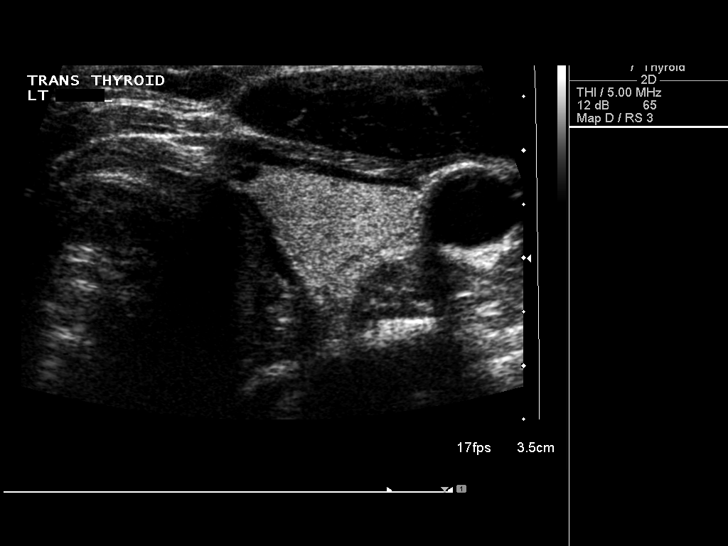
[im 30/45]
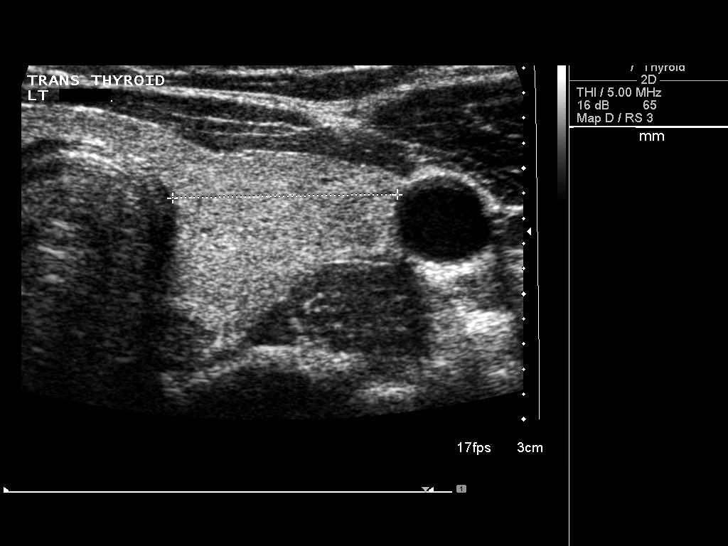
[im 34/45]
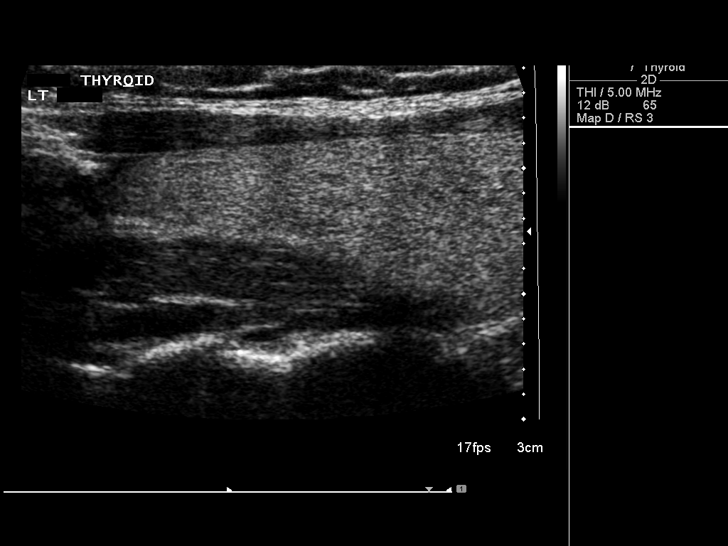
[im 37/45]
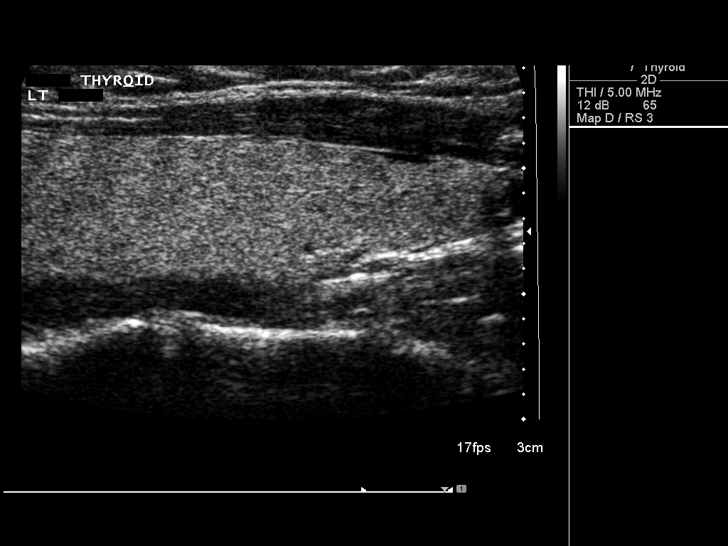
[im 41/45]
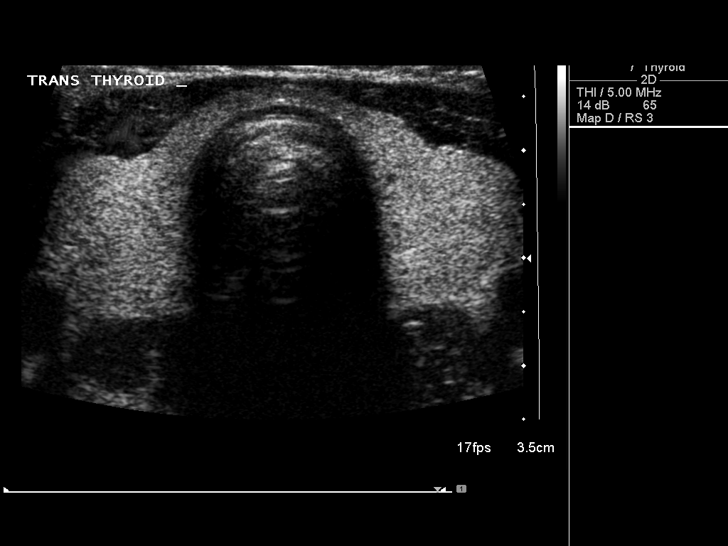
[im 45/45]
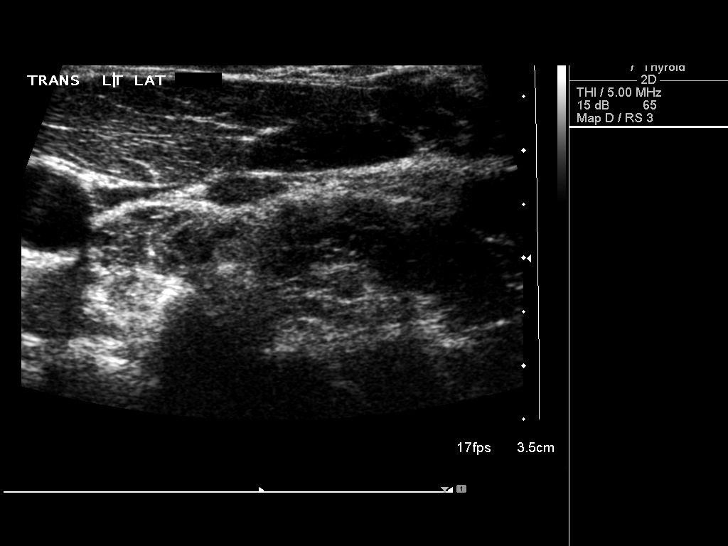

[14 of 25 positions shown; findings below may reference images not displayed]

FINDINGS: Right thyroid lobe

Measurements: 5.4 x 1.1 x 1.8 cm.  No nodules visualized.

Left thyroid lobe

Measurements: 4.9 x 1.4 x 1.8 cm.  No nodules visualized.

Isthmus

Thickness: 3 mm.  No nodules visualized.

Lymphadenopathy

Lateral neck palpable nodule correlates with an elongated mildly
prominent lymph node with short axis measurement 6 mm. This has a
benign appearance with a preserved fatty hila.
IMPRESSION: Normal thyroid ultrasound.

Mildly prominent lateral cervical lymph node correlates with
palpable abnormality.

## 2016-12-13 ENCOUNTER — Encounter (HOSPITAL_COMMUNITY): Payer: Self-pay

## 2016-12-13 ENCOUNTER — Inpatient Hospital Stay (HOSPITAL_COMMUNITY)
Admission: AD | Admit: 2016-12-13 | Discharge: 2016-12-14 | Disposition: A | Discharge status: Home / Self Care | Payer: BLUE CROSS/BLUE SHIELD | Source: Ambulatory Visit | Attending: Obstetrics & Gynecology | Admitting: Obstetrics & Gynecology

## 2016-12-13 DIAGNOSIS — R102 Pelvic and perineal pain: Secondary | ICD-10-CM

## 2016-12-13 DIAGNOSIS — Z3A01 Less than 8 weeks gestation of pregnancy: Secondary | ICD-10-CM | POA: Diagnosis not present

## 2016-12-13 DIAGNOSIS — K5900 Constipation, unspecified: Secondary | ICD-10-CM | POA: Insufficient documentation

## 2016-12-13 DIAGNOSIS — O98811 Other maternal infectious and parasitic diseases complicating pregnancy, first trimester: Secondary | ICD-10-CM | POA: Insufficient documentation

## 2016-12-13 DIAGNOSIS — O99611 Diseases of the digestive system complicating pregnancy, first trimester: Secondary | ICD-10-CM | POA: Insufficient documentation

## 2016-12-13 DIAGNOSIS — B379 Candidiasis, unspecified: Secondary | ICD-10-CM | POA: Insufficient documentation

## 2016-12-13 DIAGNOSIS — R109 Unspecified abdominal pain: Secondary | ICD-10-CM | POA: Diagnosis present

## 2016-12-13 DIAGNOSIS — Z79899 Other long term (current) drug therapy: Secondary | ICD-10-CM | POA: Insufficient documentation

## 2016-12-13 DIAGNOSIS — O26891 Other specified pregnancy related conditions, first trimester: Secondary | ICD-10-CM | POA: Insufficient documentation

## 2016-12-13 DIAGNOSIS — O9989 Other specified diseases and conditions complicating pregnancy, childbirth and the puerperium: Secondary | ICD-10-CM | POA: Diagnosis not present

## 2016-12-13 LAB — CBC
HCT: 35.4 % — ABNORMAL LOW (ref 36.0–46.0)
Hemoglobin: 12.1 g/dL (ref 12.0–15.0)
MCH: 28 pg (ref 26.0–34.0)
MCHC: 34.2 g/dL (ref 30.0–36.0)
MCV: 81.9 fL (ref 78.0–100.0)
PLATELETS: 360 10*3/uL (ref 150–400)
RBC: 4.32 MIL/uL (ref 3.87–5.11)
RDW: 13.1 % (ref 11.5–15.5)
WBC: 11.3 10*3/uL — AB (ref 4.0–10.5)

## 2016-12-13 LAB — URINALYSIS, ROUTINE W REFLEX MICROSCOPIC
BILIRUBIN URINE: NEGATIVE
GLUCOSE, UA: NEGATIVE mg/dL
KETONES UR: 20 mg/dL — AB
NITRITE: NEGATIVE
PH: 6 (ref 5.0–8.0)
Protein, ur: NEGATIVE mg/dL
Specific Gravity, Urine: 1.017 (ref 1.005–1.030)

## 2016-12-13 LAB — WET PREP, GENITAL
CLUE CELLS WET PREP: NONE SEEN
SPERM: NONE SEEN
Trich, Wet Prep: NONE SEEN

## 2016-12-13 LAB — POCT PREGNANCY, URINE
Preg Test, Ur: POSITIVE — AB
Preg Test, Ur: POSITIVE — AB

## 2016-12-13 LAB — HCG, QUANTITATIVE, PREGNANCY: HCG, BETA CHAIN, QUANT, S: 76561 m[IU]/mL — AB (ref <5)

## 2016-12-13 LAB — ABO/RH: ABO/RH(D): A POS

## 2016-12-13 MED ORDER — ACETAMINOPHEN 325 MG PO TABS
650.0000 mg | ORAL_TABLET | Freq: Once | ORAL | Status: AC
Start: 2016-12-13 — End: 2016-12-13
  Administered 2016-12-13: 650 mg via ORAL
  Filled 2016-12-13: qty 2

## 2016-12-13 MED ORDER — FLEET ENEMA 7-19 GM/118ML RE ENEM
1.0000 | ENEMA | Freq: Once | RECTAL | Status: AC
  Administered 2016-12-13: 1 via RECTAL

## 2016-12-13 NOTE — MAU Provider Note (Signed)
Patient Dorette GrateYassin Mankowski is a 31 y.o. G3P0010  [id number] here with complaints of abdominal pain for the past three days. She denies dysuria, flank pain, or abnormal discharge. She has not had a bowel movement in 4 days.    History     CSN: [id number]  Arrival date and time: 12/13/16 2126   None     Chief Complaint  Patient presents with  . Abdominal Pain   Abdominal Pain  This is a new problem. The current episode started in the past 7 days. The problem occurs constantly. The problem has been gradually worsening. The pain is located in the suprapubic region. The pain is at a severity of 8/10. The quality of the pain is cramping. The abdominal pain does not radiate. Associated symptoms include constipation and nausea. Pertinent negatives include no diarrhea or hematuria. Nothing aggravates the pain. The pain is relieved by nothing.  Vaginal Bleeding  The patient's primary symptoms include vaginal bleeding. This is a new problem. The current episode started today. Associated symptoms include abdominal pain, constipation and nausea. Pertinent negatives include no diarrhea or hematuria. The vaginal discharge was bloody. The vaginal bleeding is spotting. She has not been passing clots. She has not been passing tissue.    OB History    Gravida Para Term Preterm AB Living   3       1 0   SAB TAB Ectopic Multiple Live Births   1              Past Medical History:  Diagnosis Date  . Headache     Past Surgical History:  Procedure Laterality Date  . NO PAST SURGERIES      No family history on file.  Social History  Substance Use Topics  . Smoking status: Never Smoker  . Smokeless tobacco: Never Used  . Alcohol use No    Allergies: No Known Allergies  Prescriptions Prior to Admission  Medication Sig Dispense Refill Last Dose  . acetaminophen (TYLENOL) 500 MG tablet Take 1,000 mg by mouth every 6 (six) hours as needed for mild pain.   05/24/2015 at Unknown time  . metroNIDAZOLE  (FLAGYL) 500 MG tablet Take 1 tablet (500 mg total) by mouth 2 (two) times daily. 14 tablet 0   . Multiple Vitamin (MULTIVITAMIN WITH MINERALS) TABS tablet Take 1 tablet by mouth daily.   05/23/2015 at Unknown time    Review of Systems  Respiratory: Negative.   Cardiovascular: Negative.   Gastrointestinal: Positive for abdominal pain, constipation and nausea. Negative for diarrhea.  Genitourinary: Positive for vaginal bleeding. Negative for hematuria.  Neurological: Negative.    Physical Exam   Blood pressure 111/73, pulse 69, temperature 98.3 F (36.8 C), temperature source Oral, resp. rate 19, last menstrual period 10/24/2016, unknown if currently breastfeeding.  Physical Exam  Constitutional: She is oriented to person, place, and time. She appears well-developed and well-nourished.  HENT:  Head: Normocephalic.  Neck: Normal range of motion.  Respiratory: Effort normal.  GI: Soft. Bowel sounds are normal. She exhibits no distension and no mass. There is tenderness. There is no rebound and no guarding.  Slight tenderness over suprapubic region  Genitourinary:  Genitourinary Comments: NEFG; no lesions on vaginal walls. Cervix is pink with no lesions. No blood in the vagina. No CMT or suprapubic tenderness.   Musculoskeletal: Normal range of motion.  Neurological: She is alert and oriented to person, place, and time. She has normal reflexes.  Skin: Skin is warm and dry.  Psychiatric: She has a normal mood and affect.    MAU Course  Procedures  MDM -ABO, CBC, beta -[place]: SIUP at 6 weeks and 6 days -Fleet enema: patient passed small amount of stool and feels some relief.  -UA: small blood, 20 ketones, small leuks.  -wet prep: positive for yeast Assessment and Plan   1. Constipation, unspecified constipation type   2. Pelvic pain in pregnant patient at less than [redacted] weeks gestation   3. Yeast infection    2. Urine culture sent, GC CT pending 3. Patient stable for discharge  with recommendation to repeat Enema in the morning, start colace and increase fluids.  4. RX for Terazol sent to pharmacy on file 5. Reviewed when to return to the MAU (bleeding, increased abdominal pain) 6. Recommended that patient begin prenatal care at Ob-gyn office    Charlesetta Garibaldi Kooistra CNM 12/13/2016, 10:37 PM

## 2016-12-13 NOTE — MAU Note (Signed)
Reports via EMS for abdominal pain and cramping that has lasted for the past 3 days, occurring more often in the early morning and at night.  Pain is 10/10 and across entire abdomen.  Reports some bleeding in the toilet this evening.  No BM in 4 days.

## 2016-12-14 ENCOUNTER — Inpatient Hospital Stay (HOSPITAL_COMMUNITY): Payer: BLUE CROSS/BLUE SHIELD

## 2016-12-14 DIAGNOSIS — K5900 Constipation, unspecified: Secondary | ICD-10-CM

## 2016-12-14 DIAGNOSIS — O9989 Other specified diseases and conditions complicating pregnancy, childbirth and the puerperium: Secondary | ICD-10-CM | POA: Diagnosis not present

## 2016-12-14 LAB — GC/CHLAMYDIA PROBE AMP (~~LOC~~) NOT AT ARMC
Chlamydia: NEGATIVE
Neisseria Gonorrhea: NEGATIVE

## 2016-12-14 MED ORDER — TERCONAZOLE 0.4 % VA CREA: 1.0000 | TOPICAL_CREAM | Freq: Every day | VAGINAL | 0 refills | Status: AC

## 2016-12-14 NOTE — Discharge Instructions (Signed)
° °-  Colace 3 times a day -increase fiber; fruits and vegetables d v    Constipation, Adult Constipation is when a person:  Poops (has a bowel movement) fewer times in a week than normal.  Has a hard time pooping.  Has poop that is dry, hard, or bigger than normal.  Follow these instructions at home: Eating and drinking   Eat foods that have a lot of fiber, such as: ? Fresh fruits and vegetables. ? Whole grains. ? Beans.  Eat less of foods that are high in fat, low in fiber, or overly processed, such as: ? [place] fries. ? Hamburgers. ? Cookies. ? Candy. ? Soda.  Drink enough fluid to keep your pee (urine) clear or pale yellow. General instructions  Exercise regularly or as told by your doctor.  Go to the restroom when you feel like you need to poop. Do not hold it in.  Take over-the-counter and prescription medicines only as told by your doctor. These include any fiber supplements.  Do pelvic floor retraining exercises, such as: ? Doing deep breathing while relaxing your lower belly (abdomen). ? Relaxing your pelvic floor while pooping.  Watch your condition for any changes.  Keep all follow-up visits as told by your doctor. This is important. Contact a doctor if:  You have pain that gets worse.  You have a fever.  You have not pooped for 4 days.  You throw up (vomit).  You are not hungry.  You lose weight.  You are bleeding from the anus.  You have thin, pencil-like poop (stool). Get help right away if:  You have a fever, and your symptoms suddenly get worse.  You leak poop or have blood in your poop.  Your belly feels hard or bigger than normal (is bloated).  You have very bad belly pain.  You feel dizzy or you faint. This information is not intended to replace advice given to you by your health care provider. Make sure you discuss any questions you have with your health care provider. Document Released: 12/02/2007 Document Revised:  01/03/2016 Document Reviewed: 12/04/2015 Elsevier Interactive Patient Education  2017 [employer]

## 2017-08-16 ENCOUNTER — Other Ambulatory Visit: Payer: Self-pay | Admitting: Obstetrics and Gynecology

## 2017-08-16 ENCOUNTER — Other Ambulatory Visit (HOSPITAL_COMMUNITY)
Admission: RE | Admit: 2017-08-16 | Discharge: 2017-08-16 | Disposition: A | Discharge status: Home / Self Care | Payer: BLUE CROSS/BLUE SHIELD | Source: Ambulatory Visit | Attending: Obstetrics and Gynecology | Admitting: Obstetrics and Gynecology

## 2017-08-16 DIAGNOSIS — Z01419 Encounter for gynecological examination (general) (routine) without abnormal findings: Secondary | ICD-10-CM | POA: Diagnosis not present

## 2017-08-17 LAB — CYTOLOGY - PAP: Diagnosis: NEGATIVE

## 2017-10-23 ENCOUNTER — Encounter (HOSPITAL_COMMUNITY): Payer: Self-pay

## 2017-12-03 IMAGING — US US OB TRANSVAGINAL
1 series · 15 of 28 positions shown · non-contrast
Comparison: None.

CLINICAL DATA: 30-year-old pregnant female presenting with pelvic
pain. LMP: 10/24/2016 corresponding to an estimated gestational age
of 7 weeks, 2 days.

EXAM:
OBSTETRIC <14 WK US AND TRANSVAGINAL OB US
TECHNIQUE: Both transabdominal and transvaginal ultrasound examinations were
performed for complete evaluation of the gestation as well as the
maternal uterus, adnexal regions, and pelvic cul-de-sac.
Transvaginal technique was performed to assess early pregnancy.

[Series 1: us ob transvaginal · 15 of 33 slices shown]
[im 1/33]
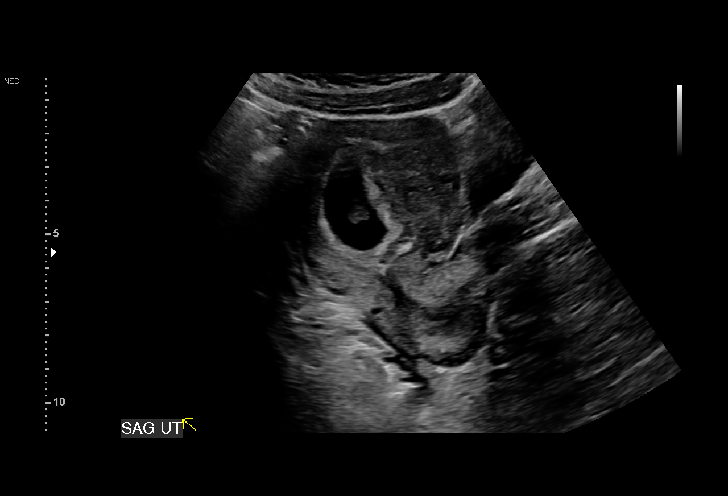
[im 3/33]
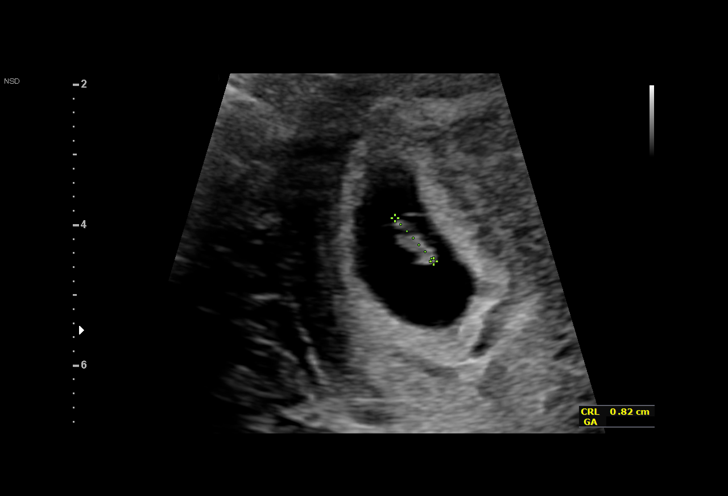
[im 5/33]
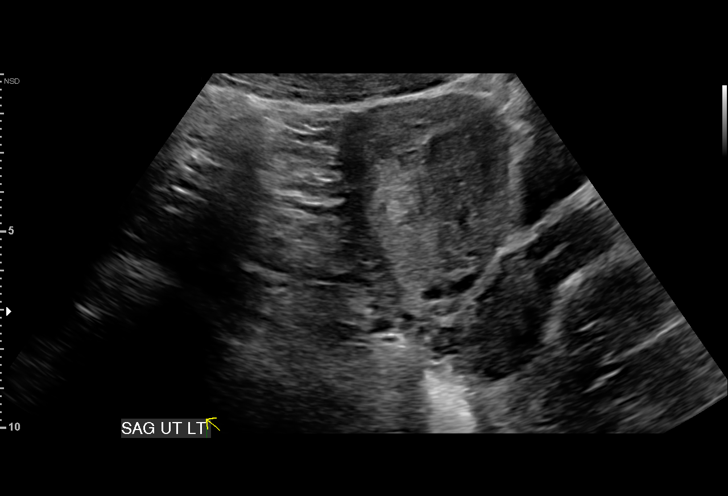
[im 8/33]
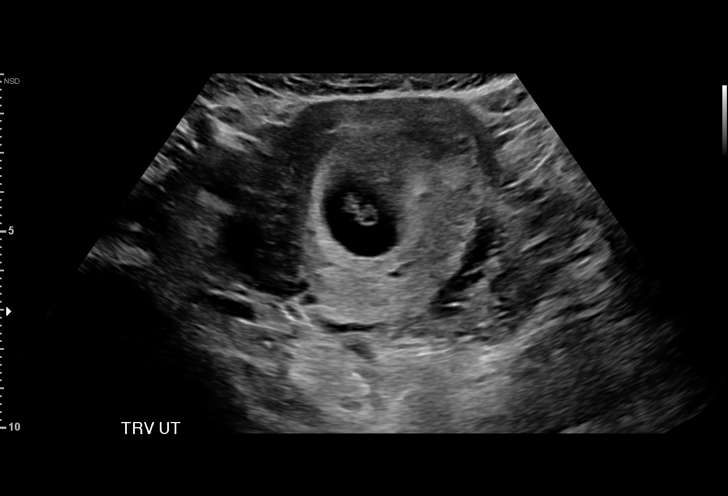
[im 10/33]
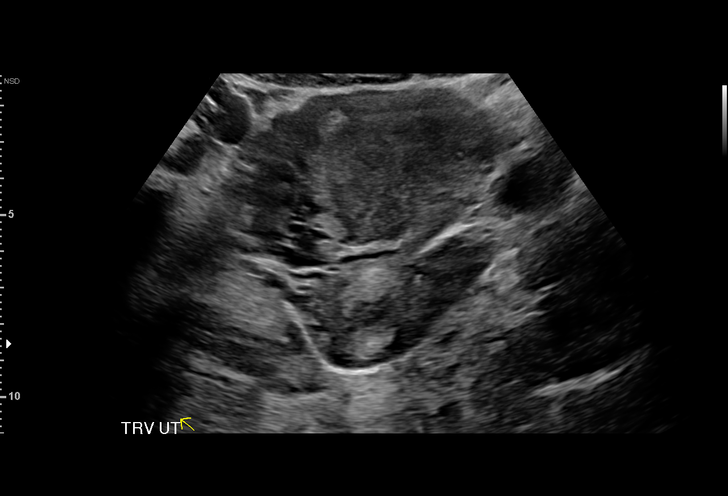
[im 12/33]
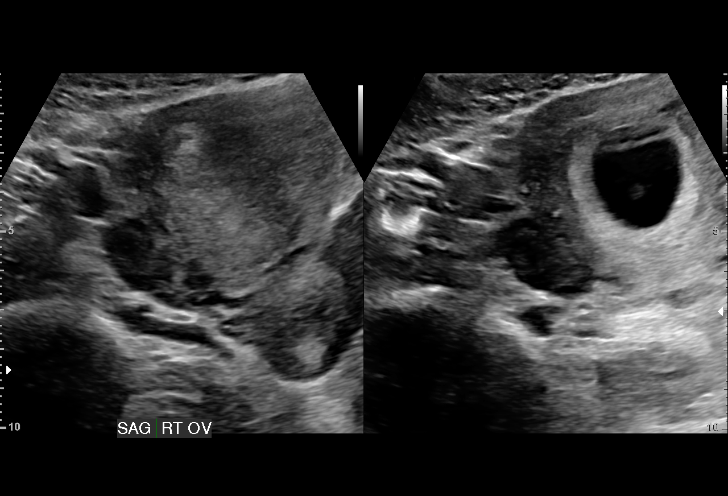
[im 15/33]
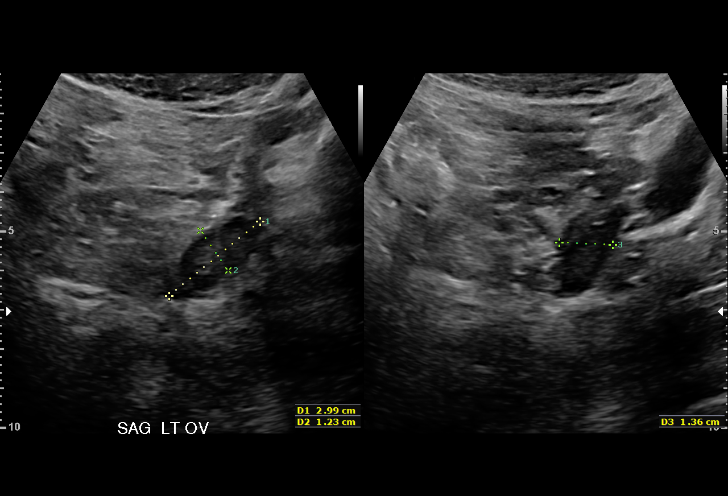
[im 17/33]
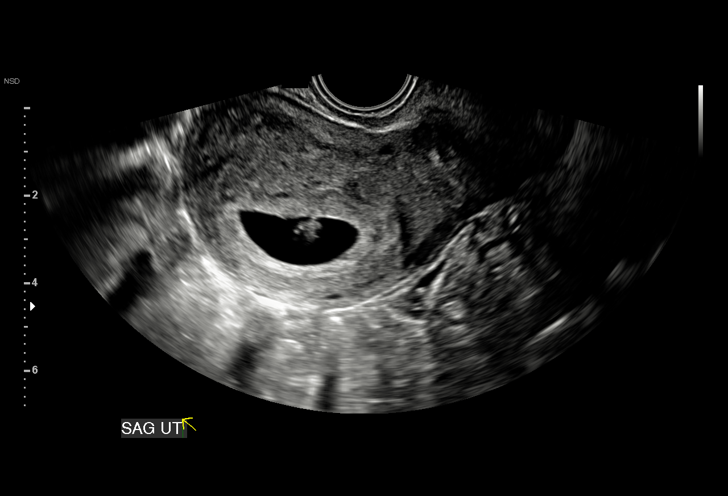
[im 18/33]
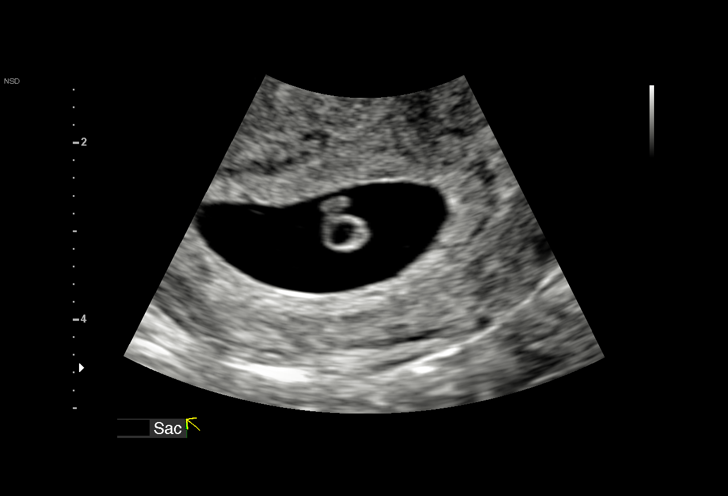
[im 21/33]
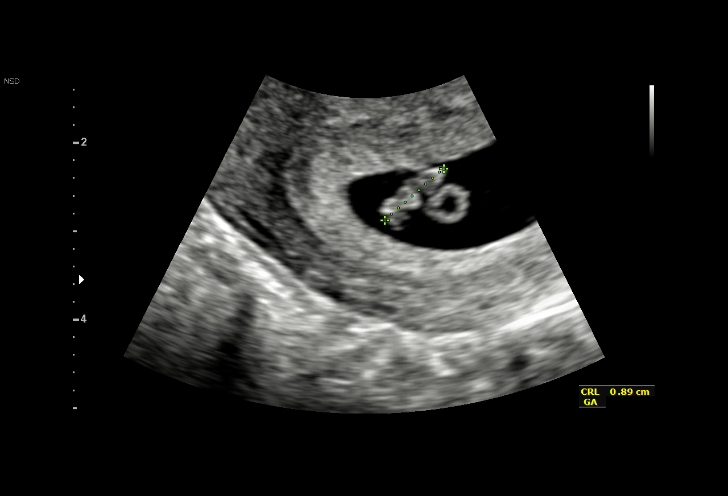
[im 23/33]
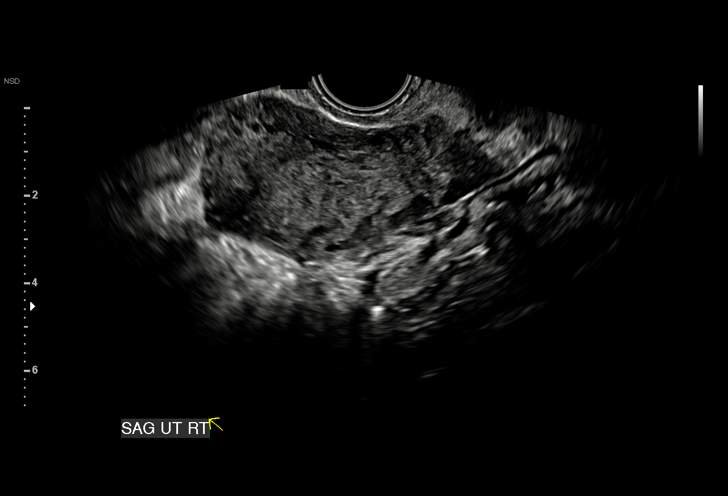
[im 25/33]
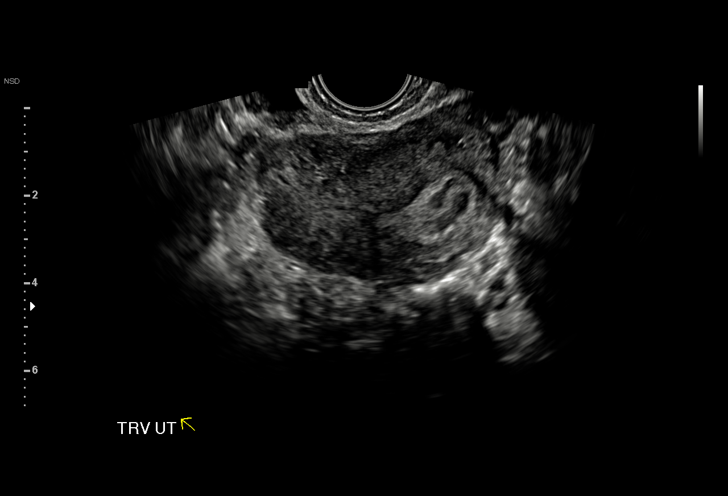
[im 28/33]
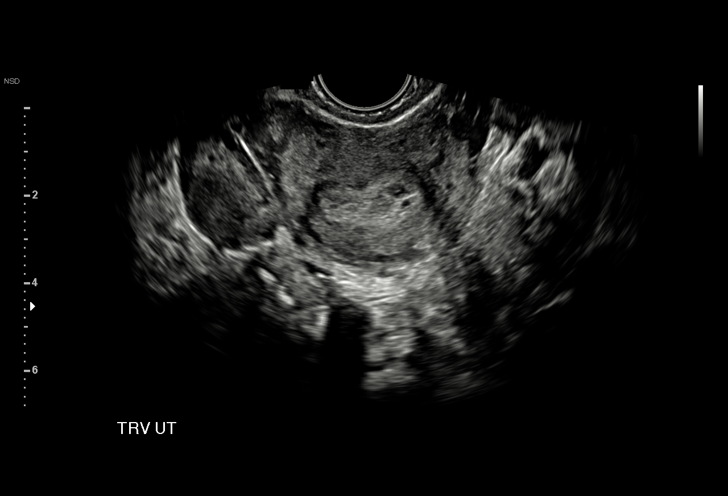
[im 30/33]
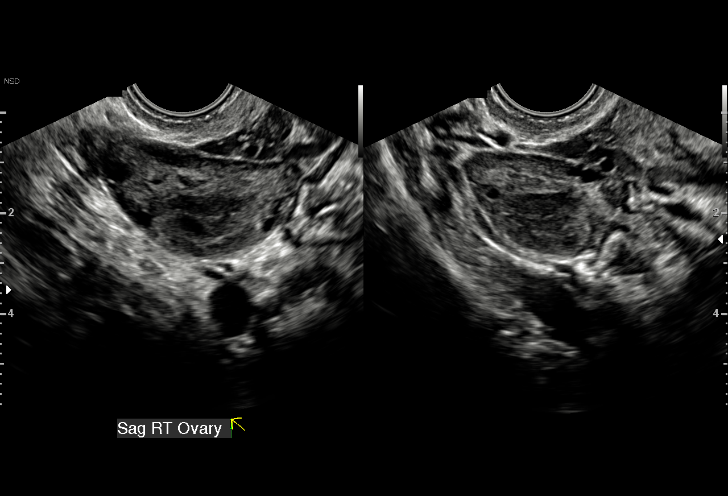
[im 33/33]
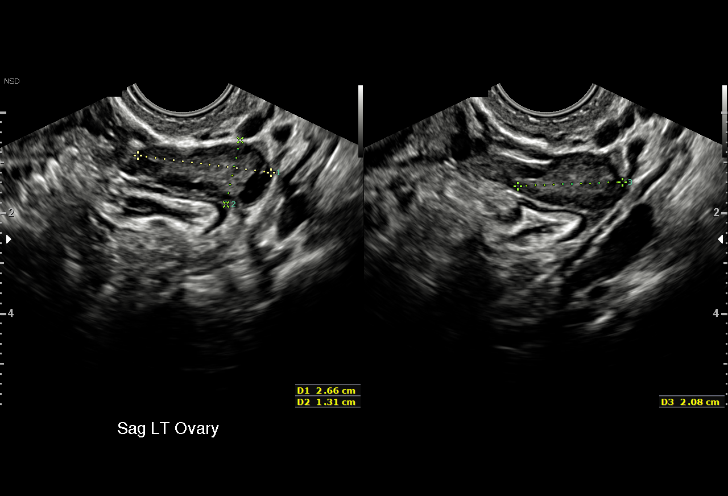

[15 of 28 positions shown; findings below may reference images not displayed]

FINDINGS: Intrauterine gestational sac: None

Yolk sac:  Visualized.

Embryo:  Visualized.

Cardiac Activity: Visualized.

Heart Rate: 129  Bpm

CRL:  9  mm   6 w   6 d                  US EDC: 08/03/2017

Subchorionic hemorrhage:  None visualized.

Maternal uterus/adnexae: Unremarkable. The corpus luteum is noted in
the right ovary.
IMPRESSION: Single live intrauterine pregnancy with an estimated gestational age
of 6 weeks, 6 days.
